# Patient Record
Sex: Female | Born: 1995 | Race: White | Hispanic: No | Marital: Single | Smoking: Never smoker
Health system: Southern US, Community
[De-identification: ages and names within clinical notes are randomized; demographics above are authoritative.]

---

## 2015-04-28 ENCOUNTER — Emergency Department (HOSPITAL_COMMUNITY)
Admission: EM | Admit: 2015-04-28 | Discharge: 2015-04-28 | Disposition: A | Discharge status: Home / Self Care | Payer: PRIVATE HEALTH INSURANCE | Attending: Emergency Medicine | Admitting: Emergency Medicine

## 2015-04-28 ENCOUNTER — Encounter (HOSPITAL_COMMUNITY): Payer: Self-pay | Admitting: Family Medicine

## 2015-04-28 ENCOUNTER — Emergency Department (HOSPITAL_COMMUNITY): Payer: PRIVATE HEALTH INSURANCE

## 2015-04-28 DIAGNOSIS — Z3202 Encounter for pregnancy test, result negative: Secondary | ICD-10-CM | POA: Diagnosis not present

## 2015-04-28 DIAGNOSIS — R1031 Right lower quadrant pain: Secondary | ICD-10-CM | POA: Diagnosis not present

## 2015-04-28 DIAGNOSIS — R110 Nausea: Secondary | ICD-10-CM | POA: Insufficient documentation

## 2015-04-28 DIAGNOSIS — R109 Unspecified abdominal pain: Secondary | ICD-10-CM | POA: Diagnosis present

## 2015-04-28 LAB — CBC WITH DIFFERENTIAL/PLATELET
Basophils Absolute: 0 10*3/uL (ref 0.0–0.1)
Basophils Relative: 0 % (ref 0–1)
Eosinophils Absolute: 0.1 10*3/uL (ref 0.0–0.7)
Eosinophils Relative: 1 % (ref 0–5)
HCT: 40.1 % (ref 36.0–46.0)
Hemoglobin: 13.9 g/dL (ref 12.0–15.0)
Lymphocytes Relative: 26 % (ref 12–46)
Lymphs Abs: 1.9 10*3/uL (ref 0.7–4.0)
MCH: 31.4 pg (ref 26.0–34.0)
MCHC: 34.7 g/dL (ref 30.0–36.0)
MCV: 90.5 fL (ref 78.0–100.0)
Monocytes Absolute: 0.5 10*3/uL (ref 0.1–1.0)
Monocytes Relative: 6 % (ref 3–12)
Neutro Abs: 4.9 10*3/uL (ref 1.7–7.7)
Neutrophils Relative %: 67 % (ref 43–77)
Platelets: 280 10*3/uL (ref 150–400)
RBC: 4.43 MIL/uL (ref 3.87–5.11)
RDW: 12.2 % (ref 11.5–15.5)
WBC: 7.4 10*3/uL (ref 4.0–10.5)

## 2015-04-28 LAB — URINALYSIS, ROUTINE W REFLEX MICROSCOPIC
Bilirubin Urine: NEGATIVE
Glucose, UA: NEGATIVE mg/dL
Hgb urine dipstick: NEGATIVE
Ketones, ur: NEGATIVE mg/dL
Leukocytes, UA: NEGATIVE
Nitrite: NEGATIVE
Protein, ur: NEGATIVE mg/dL
Specific Gravity, Urine: 1.014 (ref 1.005–1.030)
Urobilinogen, UA: 0.2 mg/dL (ref 0.0–1.0)
pH: 7 (ref 5.0–8.0)

## 2015-04-28 LAB — COMPREHENSIVE METABOLIC PANEL
ALT: 13 U/L — ABNORMAL LOW (ref 14–54)
AST: 18 U/L (ref 15–41)
Albumin: 4 g/dL (ref 3.5–5.0)
Alkaline Phosphatase: 46 U/L (ref 38–126)
Anion gap: 7 (ref 5–15)
BUN: 6 mg/dL (ref 6–20)
CO2: 26 mmol/L (ref 22–32)
Calcium: 9.3 mg/dL (ref 8.9–10.3)
Chloride: 103 mmol/L (ref 101–111)
Creatinine, Ser: 0.83 mg/dL (ref 0.44–1.00)
GFR calc Af Amer: >60 mL/min (ref >60)
GFR calc non Af Amer: >60 mL/min (ref >60)
Glucose, Bld: 97 mg/dL (ref 65–99)
Potassium: 4.3 mmol/L (ref 3.5–5.1)
Sodium: 136 mmol/L (ref 135–145)
Total Bilirubin: 0.7 mg/dL (ref 0.3–1.2)
Total Protein: 6.7 g/dL (ref 6.5–8.1)

## 2015-04-28 LAB — POC URINE PREG, ED: Preg Test, Ur: NEGATIVE

## 2015-04-28 MED ORDER — TRAMADOL HCL 50 MG PO TABS: 50.0000 mg | ORAL_TABLET | Freq: Four times a day (QID) | ORAL | Status: AC | PRN

## 2015-04-28 MED ORDER — SODIUM CHLORIDE 0.9 % IV BOLUS (SEPSIS)
1000.0000 mL | Freq: Once | INTRAVENOUS | Status: AC
  Administered 2015-04-28: 1000 mL via INTRAVENOUS

## 2015-04-28 MED ORDER — MORPHINE SULFATE 4 MG/ML IJ SOLN
6.0000 mg | Freq: Once | INTRAMUSCULAR | Status: DC
  Filled 2015-04-28: qty 2

## 2015-04-28 MED ORDER — IOHEXOL 300 MG/ML  SOLN: 100.0000 mL | Freq: Once | INTRAMUSCULAR | Status: DC | PRN

## 2015-04-28 MED ORDER — IOHEXOL 300 MG/ML  SOLN
80.0000 mL | Freq: Once | INTRAMUSCULAR | Status: AC | PRN
Start: 2015-04-28 — End: 2015-04-28
  Administered 2015-04-28: 80 mL via INTRAVENOUS

## 2015-04-28 MED ORDER — ONDANSETRON HCL 4 MG/2ML IJ SOLN
4.0000 mg | Freq: Once | INTRAMUSCULAR | Status: AC
  Administered 2015-04-28: 4 mg via INTRAVENOUS
  Filled 2015-04-28: qty 2

## 2015-04-28 MED ORDER — IOHEXOL 300 MG/ML  SOLN
25.0000 mL | Freq: Once | INTRAMUSCULAR | Status: AC | PRN
  Administered 2015-04-28: 25 mL via ORAL

## 2015-04-28 NOTE — ED Notes (Signed)
Patient transported to CT 

## 2015-04-28 NOTE — ED Provider Notes (Signed)
CSN: [id number]     Arrival date & time 04/28/15  1338 History   First MD Initiated Contact with Patient 04/28/15 1501     Chief Complaint  Patient presents with  . Abdominal Pain     (Consider location/radiation/quality/duration/timing/severity/associated sxs/prior Treatment) HPI   Jessica Hutchinson with abdominal pain. Gradual onset yesterday. Initially began just below umbilicus. Has since migrated towards RLQ. Initially intermittent achy/squeezing. Now constant. Worse with certain movement and palpation. Mildly improved when curled up in fetal position. Nausea after eating. No vomiting. No urinary complaints. No unusual vaginal bleeding or discharge. No fever or chills. No significant past medical or surgical history.   History reviewed. No pertinent past medical history. History reviewed. No pertinent past surgical history. History reviewed. No pertinent family history. History  Substance Use Topics  . Smoking status: Never Smoker   . Smokeless tobacco: Not on file  . Alcohol Use: No   OB History    No data available     Review of Systems  All systems reviewed and negative, other than as noted in HPI.   Allergies  Review of patient's allergies indicates no known allergies.  Home Medications   Prior to Admission medications   Not on File   BP 151/84 mmHg  Pulse 68  Temp(Src) 98.3 F (36.8 C) (Oral)  Resp 18  Ht  (1.626 m)  Wt 152 lb 1.9 oz (69 kg)  BMI 26.10 kg/m2  SpO2 100%  LMP 04/21/2015 Physical Exam  Constitutional: She appears well-developed and well-nourished. No distress.  HENT:  Head: Normocephalic and atraumatic.  Eyes: Conjunctivae are normal. Right eye exhibits no discharge. Left eye exhibits no discharge.  Neck: Neck supple.  Cardiovascular: Normal rate, regular rhythm and normal heart sounds.  Exam reveals no gallop and no friction rub.   No murmur heard. Pulmonary/Chest: Effort normal and breath sounds normal. No respiratory distress.  Abdominal:  Soft. She exhibits no distension. There is tenderness. There is no rebound and no guarding.  Tenderness across lower abdomen. Worse suprapubically and RLQ.   Genitourinary:  No cva tenderness  Musculoskeletal: She exhibits no edema or tenderness.  Neurological: She is alert.  Skin: Skin is warm and dry.  Psychiatric: She has a normal mood and affect. Her behavior is normal. Thought content normal.  Nursing note and vitals reviewed.   ED Course  Procedures (including critical care time) Labs Review Labs Reviewed  COMPREHENSIVE METABOLIC PANEL - Abnormal; Notable for the following:    ALT 13 (*)    All other components within normal limits  CBC WITH DIFFERENTIAL/PLATELET  URINALYSIS, ROUTINE W REFLEX MICROSCOPIC (NOT AT [facility])  POC URINE PREG, ED    Imaging Review No results found.   Ct Abdomen Pelvis W Contrast  04/28/2015   CLINICAL DATA:  Right lower quadrant abdominal pain for 2 days with nausea.  EXAM: CT ABDOMEN AND PELVIS WITH CONTRAST  TECHNIQUE: Multidetector CT imaging of the abdomen and pelvis was performed using the standard protocol following bolus administration of intravenous contrast.  CONTRAST:  80 cc Omnipaque 300  COMPARISON:  None.  FINDINGS: Lower chest:  Unremarkable  Hepatobiliary: Unremarkable  Pancreas: Unremarkable  Spleen: Unremarkable  Adrenals/Urinary Tract: Unremarkable  Stomach/Bowel: Prominent stool throughout the colon favors constipation. Appendix normal.  Vascular/Lymphatic: Unremarkable  Reproductive: Right adnexal cystic lesion, probably arising from the ovary, 3.3 by 2.5 cm, image 73 series 2. Trace free pelvic fluid posterior to the uterus and along the inferior margin of the right paracolic gutter.  Other: No supplemental non-categorized findings.  Musculoskeletal: Unremarkable  IMPRESSION: 1.  Prominent stool throughout the colon favors constipation. 2. Right adnexal cystic lesion, 3.3 by 2.5 cm. 3. Trace free pelvic fluid, including a small amount of  fluid inferiorly in the right paracolic gutter. 4. Appendix normal.   Electronically Signed   By: Gaylyn Rong M.D.   On: 04/28/2015 17:53    EKG Interpretation None      MDM   Final diagnoses:  RLQ abdominal pain     Jessica Hutchinson with abdominal pain. Sent from [facility] over concern for possible appendicitis which I have some concerns for as well. Will CT.   Symptoms likely from ovarian cyst. Afebrile. Nontoxic. Symptom control at this time. It has been determined that no acute conditions requiring further emergency intervention are present at this time. The patient has been advised of the diagnosis and plan. I reviewed any labs and imaging including any potential incidental findings. We have discussed signs and symptoms that warrant return to the ED and they are listed in the discharge instructions.      Raeford Razor, MD 05/02/15 1124

## 2015-04-28 NOTE — ED Notes (Signed)
Pt here for RLQ pain that started yesterday. sts some nausea and LBM 3 days ago. Sent here by [facility] r/o appy. Pt having rebound tenderness and sts pain with walking

## 2016-01-04 IMAGING — CT CT ABD-PELV W/ CM
2 of 4 series · 17 of 46 positions shown, 19 images · IV contrast (APPLIED)
Comparison: None.

CLINICAL DATA: Right lower quadrant abdominal pain for 2 days with
nausea.

EXAM:
CT ABDOMEN AND PELVIS WITH CONTRAST
TECHNIQUE: Multidetector CT imaging of the abdomen and pelvis was performed
using the standard protocol following bolus administration of
intravenous contrast.
CONTRAST:  80 cc Omnipaque 300

[Series 2: abd/ pelvis 5.0 i30f 1 · axial · 0.78mm/px · z∈[+857,+1272]mm · 14 of 91 slices shown, 16 images]
[im 4/91  soft-tissue]
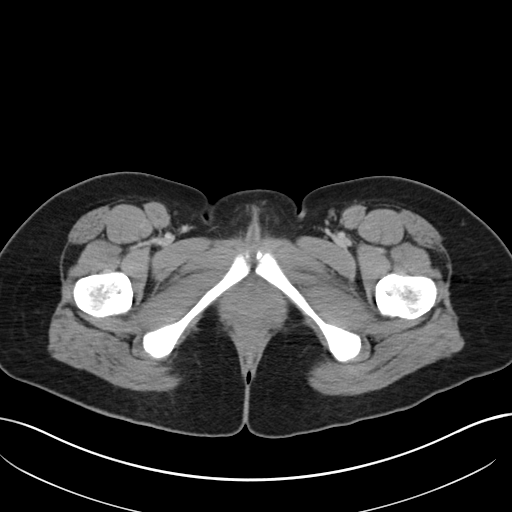
[im 4/91  bone]
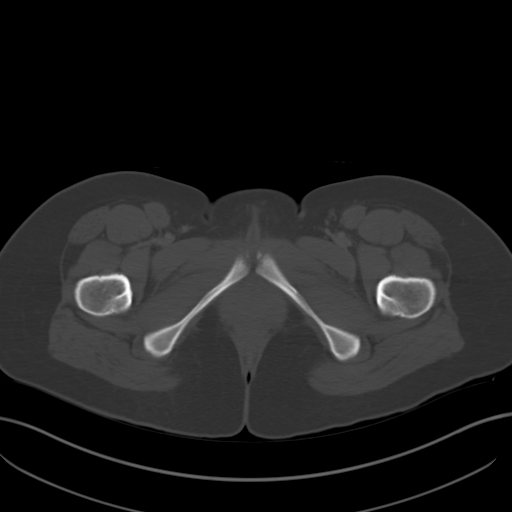
[im 12/91  soft-tissue]
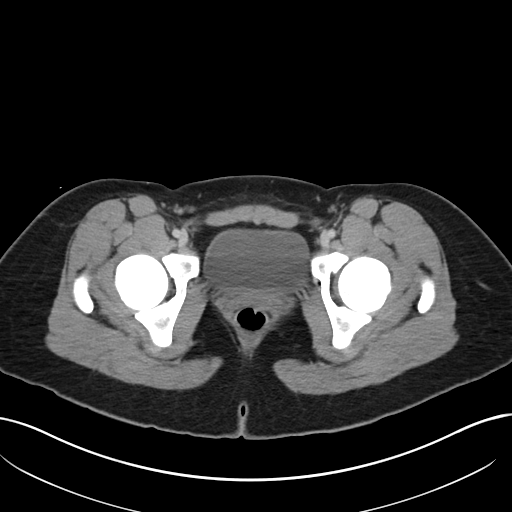
[im 19/91  soft-tissue]
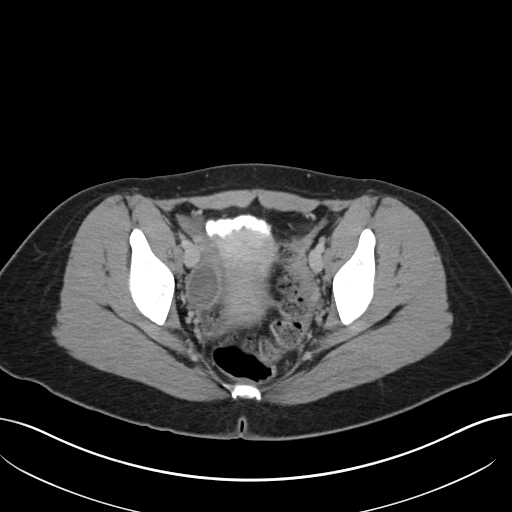
[im 23/91  soft-tissue]
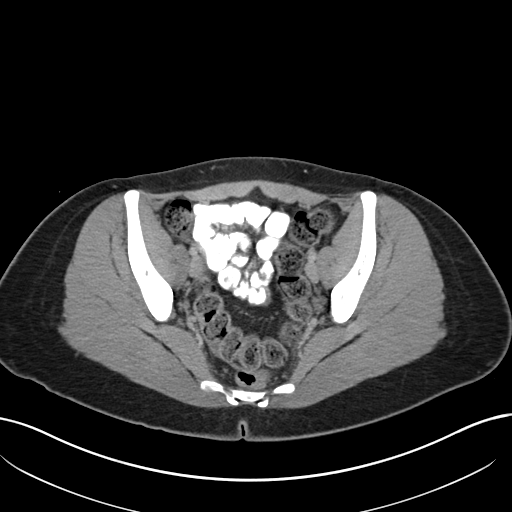
[im 31/91  soft-tissue]
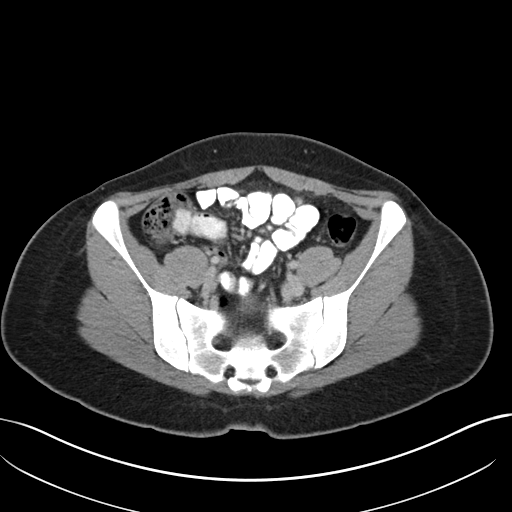
[im 38/91  soft-tissue]
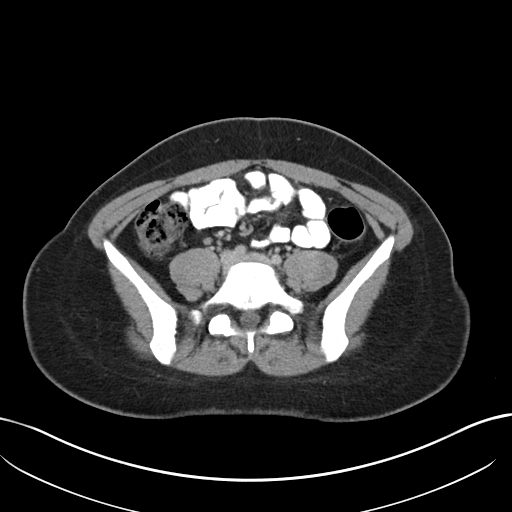
[im 42/91  soft-tissue]
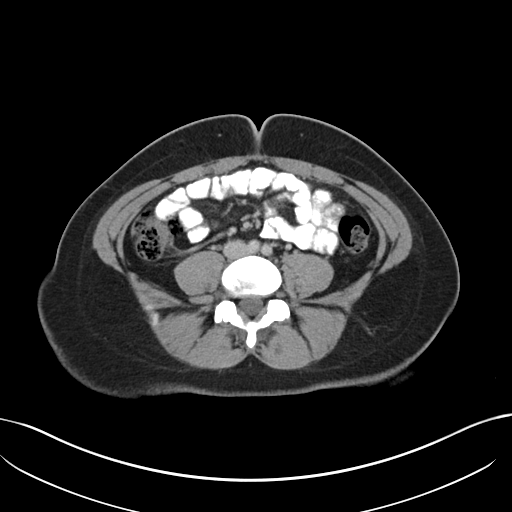
[im 49/91  soft-tissue]
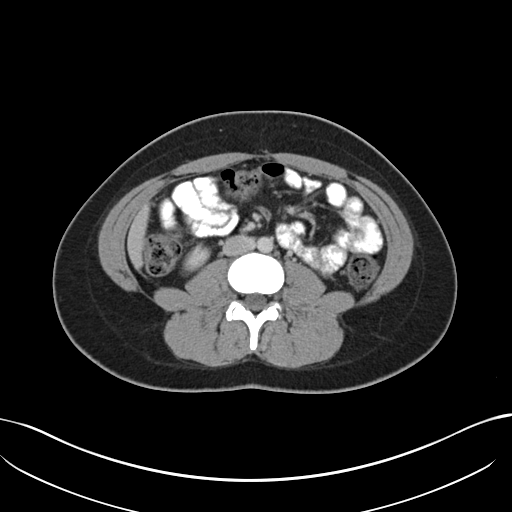
[im 53/91  soft-tissue]
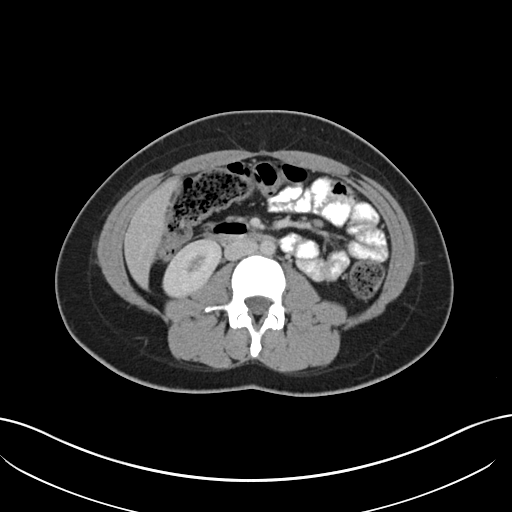
[im 53/91  bone]
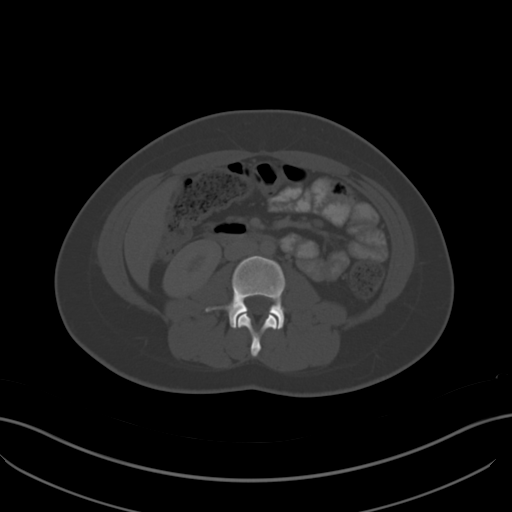
[im 61/91  soft-tissue]
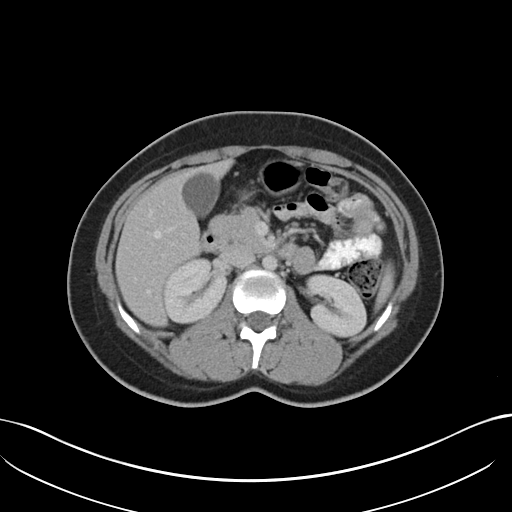
[im 68/91  soft-tissue]
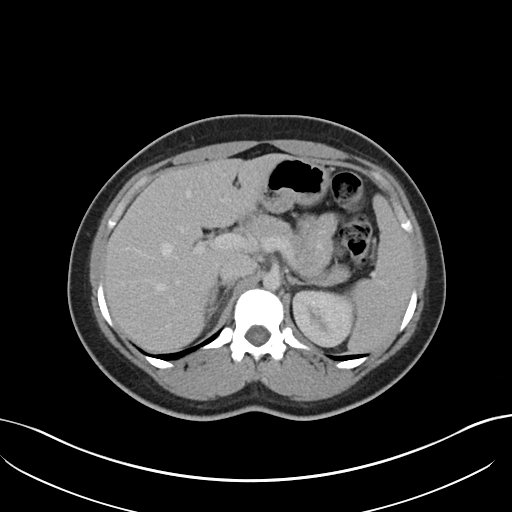
[im 72/91  soft-tissue]
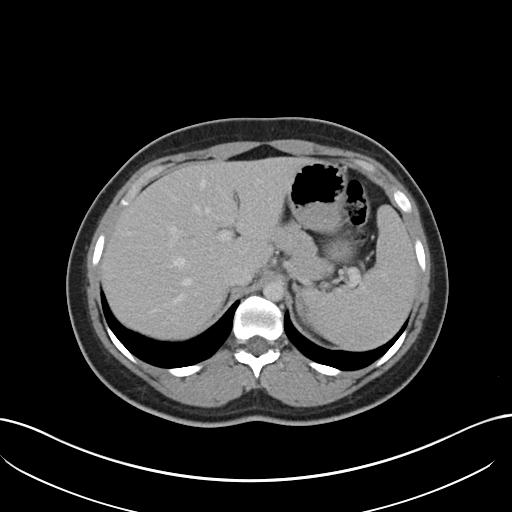
[im 79/91  soft-tissue]
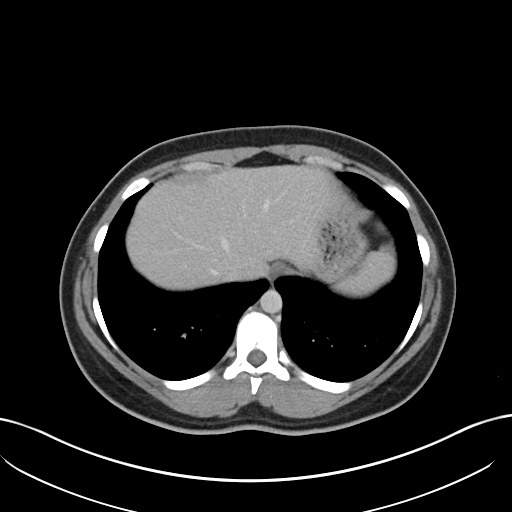
[im 87/91  soft-tissue]
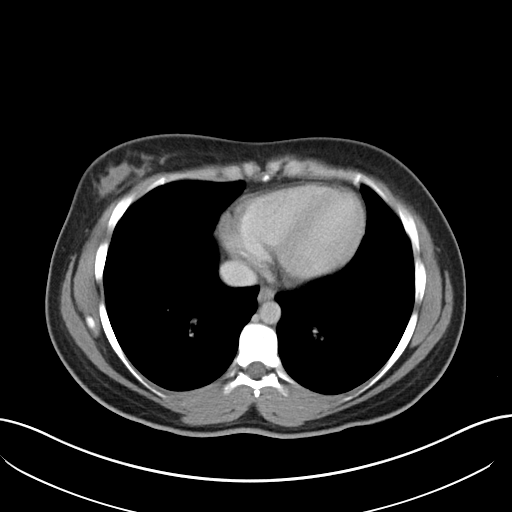

[Series 5: coronal soft tissue · coronal · 0.73mm/px · 3 of 85 slices shown]
[im 29/85  soft-tissue]
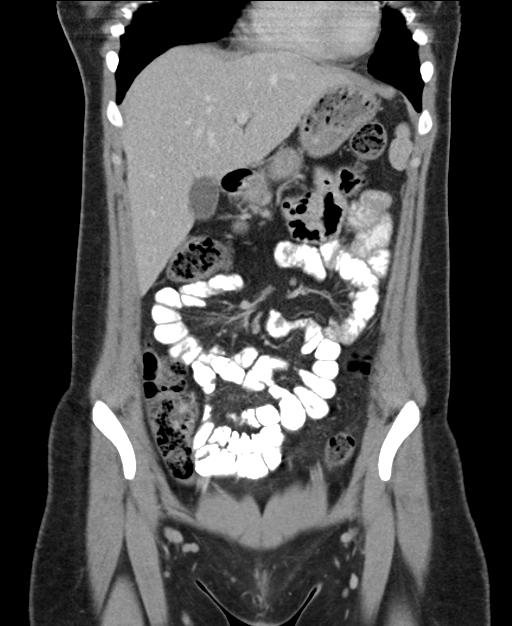
[im 38/85  soft-tissue]
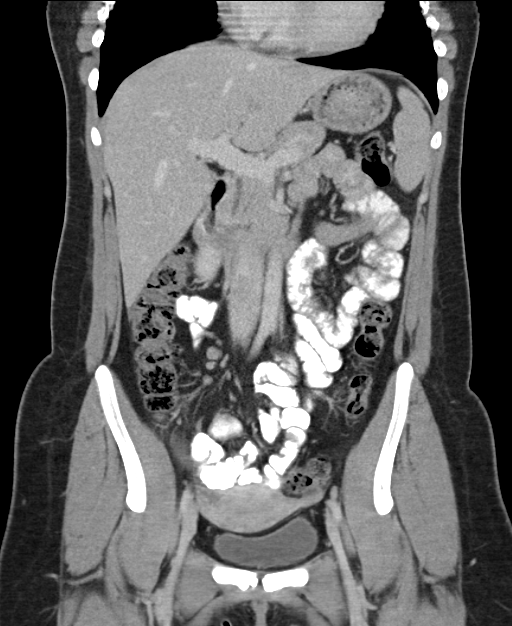
[im 47/85  soft-tissue]
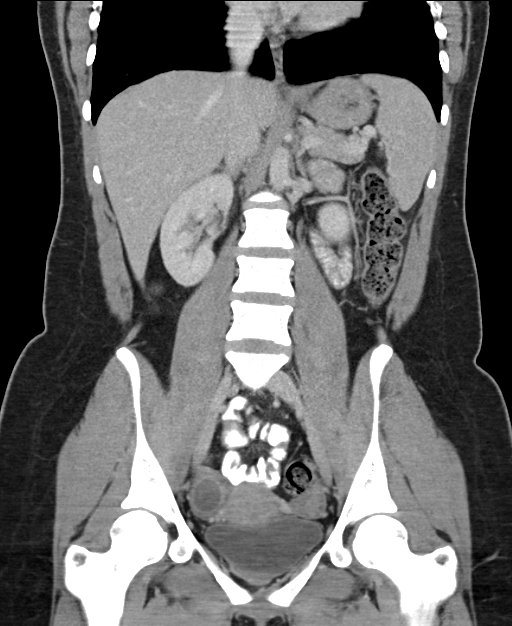

[17 of 46 positions shown; findings below may reference images not displayed]

FINDINGS: Lower chest:  Unremarkable

Hepatobiliary: Unremarkable

Pancreas: Unremarkable

Spleen: Unremarkable

Adrenals/Urinary Tract: Unremarkable

Stomach/Bowel: Prominent stool throughout the colon favors
constipation. Appendix normal.

Vascular/Lymphatic: Unremarkable

Reproductive: Right adnexal cystic lesion, probably arising from the
ovary, 3.3 by 2.5 cm, image 73 series 2. Trace free pelvic fluid
posterior to the uterus and along the inferior margin of the right
paracolic gutter.

Other: No supplemental non-categorized findings.

Musculoskeletal: Unremarkable
IMPRESSION: 1.  Prominent stool throughout the colon favors constipation.
2. Right adnexal cystic lesion, 3.3 by 2.5 cm.
3. Trace free pelvic fluid, including a small amount of fluid
inferiorly in the right paracolic gutter.
4. Appendix normal.
